# Patient Record
Sex: Female | Born: 1975 | Race: White | Hispanic: No | Marital: Married | Smoking: Never smoker
Health system: Southern US, Community
[De-identification: ages and names within clinical notes are randomized; demographics above are authoritative.]

## PROBLEM LIST (undated history)

## (undated) DIAGNOSIS — F419 Anxiety disorder, unspecified: Secondary | ICD-10-CM

## (undated) DIAGNOSIS — N6320 Unspecified lump in the left breast, unspecified quadrant: Principal | ICD-10-CM

## (undated) DIAGNOSIS — H04129 Dry eye syndrome of unspecified lacrimal gland: Secondary | ICD-10-CM

## (undated) HISTORY — DX: Anxiety disorder, unspecified: F41.9

## (undated) HISTORY — DX: Dry eye syndrome of unspecified lacrimal gland: H04.129

## (undated) HISTORY — DX: Unspecified lump in the left breast, unspecified quadrant: N63.20

---

## 2006-03-03 ENCOUNTER — Emergency Department: Payer: Self-pay | Admitting: Emergency Medicine

## 2012-03-30 ENCOUNTER — Emergency Department: Payer: Self-pay | Admitting: *Deleted

## 2012-03-30 LAB — URINALYSIS, COMPLETE
Bilirubin,UR: NEGATIVE
Blood: NEGATIVE
Glucose,UR: NEGATIVE mg/dL (ref 0–75)
Nitrite: NEGATIVE
Specific Gravity: 1.014 (ref 1.003–1.030)
Squamous Epithelial: 2
WBC UR: 1 /HPF (ref 0–5)

## 2012-03-30 LAB — CBC
HCT: 41.6 % (ref 35.0–47.0)
MCH: 29.2 pg (ref 26.0–34.0)
MCHC: 32.6 g/dL (ref 32.0–36.0)
MCV: 90 fL (ref 80–100)
Platelet: 303 10*3/uL (ref 150–440)
RBC: 4.64 10*6/uL (ref 3.80–5.20)
RDW: 13.2 % (ref 11.5–14.5)
WBC: 9.8 10*3/uL (ref 3.6–11.0)

## 2012-03-30 LAB — COMPREHENSIVE METABOLIC PANEL
Albumin: 3.5 g/dL (ref 3.4–5.0)
Alkaline Phosphatase: 101 U/L (ref 50–136)
Anion Gap: 10 (ref 7–16)
BUN: 8 mg/dL (ref 7–18)
Bilirubin,Total: 0.5 mg/dL (ref 0.2–1.0)
Calcium, Total: 8.2 mg/dL — ABNORMAL LOW (ref 8.5–10.1)
Creatinine: 0.76 mg/dL (ref 0.60–1.30)
EGFR (African American): >60
Glucose: 83 mg/dL (ref 65–99)
Osmolality: 279 (ref 275–301)
Potassium: 3.8 mmol/L (ref 3.5–5.1)
SGOT(AST): 30 U/L (ref 15–37)
SGPT (ALT): 46 U/L
Total Protein: 7.3 g/dL (ref 6.4–8.2)

## 2014-12-09 ENCOUNTER — Ambulatory Visit: Payer: Self-pay | Admitting: Physician Assistant

## 2017-02-07 ENCOUNTER — Encounter: Payer: Self-pay | Admitting: Obstetrics and Gynecology

## 2017-02-07 ENCOUNTER — Ambulatory Visit (INDEPENDENT_AMBULATORY_CARE_PROVIDER_SITE_OTHER): Payer: 59 | Admitting: Obstetrics and Gynecology

## 2017-02-07 VITALS — BP 124/74 | Ht 66.0 in | Wt 242.0 lb

## 2017-02-07 DIAGNOSIS — Z1389 Encounter for screening for other disorder: Secondary | ICD-10-CM | POA: Diagnosis not present

## 2017-02-07 DIAGNOSIS — Z1339 Encounter for screening examination for other mental health and behavioral disorders: Secondary | ICD-10-CM

## 2017-02-07 DIAGNOSIS — Z1231 Encounter for screening mammogram for malignant neoplasm of breast: Secondary | ICD-10-CM

## 2017-02-07 DIAGNOSIS — Z01419 Encounter for gynecological examination (general) (routine) without abnormal findings: Secondary | ICD-10-CM

## 2017-02-07 DIAGNOSIS — Z30433 Encounter for removal and reinsertion of intrauterine contraceptive device: Secondary | ICD-10-CM

## 2017-02-07 DIAGNOSIS — E6609 Other obesity due to excess calories: Secondary | ICD-10-CM

## 2017-02-07 DIAGNOSIS — Z124 Encounter for screening for malignant neoplasm of cervix: Secondary | ICD-10-CM | POA: Diagnosis not present

## 2017-02-07 DIAGNOSIS — Z1239 Encounter for other screening for malignant neoplasm of breast: Secondary | ICD-10-CM

## 2017-02-07 DIAGNOSIS — Z1331 Encounter for screening for depression: Secondary | ICD-10-CM

## 2017-02-07 DIAGNOSIS — Z6839 Body mass index (BMI) 39.0-39.9, adult: Secondary | ICD-10-CM

## 2017-02-07 MED ORDER — LEVONORGESTREL 20 MCG/24HR IU IUD: 1.0000 | INTRAUTERINE_SYSTEM | Freq: Once | INTRAUTERINE | 0 refills | Status: DC

## 2017-02-07 NOTE — Progress Notes (Signed)
Gynecology Annual Exam  PCP: Pcp Not In System  Chief Complaint  Patient presents with  . Annual Exam    History of Present Illness:  Ms. Jennifer Freeman is a 41 y.o. G1P0101 who LMP was No LMP recorded. Patient is not currently having periods (Reason: IUD)., presents today for her annual examination.  Her menses are rare, lasting.  Dysmenorrhea none. She does not have intermenstrual bleeding.  She is single partner, contraception - IUD.  Last Pap: July 05, 2014  Results were: no abnormalities /neg HPV DNA negative Hx of STDs: none  Last mammogram: October 03, 2015  Results were: normal--routine follow-up in 12 months There is no FH of breast cancer. There is no FH of ovarian cancer. The patient does do self-breast exams.  Tobacco use: The patient denies current or previous tobacco use. Alcohol use: social drinker Exercise: not active  The patient wears seatbelts: yes.     Her IUD was placed in 01/2012. She has had no issues with it and would like it replaced.      Review of Systems: Review of Systems  Constitutional: Negative.   HENT: Negative.   Eyes: Negative.   Respiratory: Negative.   Cardiovascular: Negative.   Gastrointestinal: Negative.   Genitourinary: Negative.   Musculoskeletal: Negative.   Skin: Negative.   Neurological: Negative.   Psychiatric/Behavioral: Negative.     Past Medical History: History reviewed. No pertinent past medical history.   Past Surgical History:  Procedure Laterality Date  . CESAREAN SECTION      Medications:   Medication Sig Start Date End Date Taking? Authorizing Provider  levonorgestrel (MIRENA) 20 MCG/24HR IUD 1 each by Intrauterine route once.   Yes Historical Provider, MD    Allergies: No Known Allergies  Gynecologic History: No LMP recorded. Patient is not currently having periods (Reason: IUD).  Obstetric History: G1P0101  Social History   Social History  . Marital status: Married    Spouse name: N/A  .  Number of children: N/A  . Years of education: N/A   Occupational History  . Not on file.   Social History Main Topics  . Smoking status: Never Smoker  . Smokeless tobacco: Never Used  . Alcohol use Yes  . Drug use: Unknown  . Sexual activity: Yes    Birth control/ protection: IUD   Other Topics Concern  . Not on file   Social History Narrative  . No narrative on file    Family History: no gyn cancers  Physical Exam BP 124/74   Ht 5' 6"  (1.676 m)   Wt 242 lb (109.8 kg)   BMI 39.06 kg/m   Physical Exam  Constitutional: She is oriented to person, place, and time. She appears well-developed and well-nourished. No distress.  HENT:  Head: Normocephalic and atraumatic.  Eyes: Conjunctivae are normal.  Neck: Normal range of motion. Neck supple. No thyromegaly present.  Cardiovascular: Normal rate, regular rhythm and normal heart sounds.  Exam reveals no gallop and no friction rub.   No murmur heard. Pulmonary/Chest: Effort normal and breath sounds normal. She has no wheezes.  Abdominal: Soft. She exhibits no distension and no mass. There is no tenderness. There is no rebound and no guarding. No hernia. Hernia confirmed negative in the right inguinal area and confirmed negative in the left inguinal area.  Genitourinary: Pelvic exam was performed with patient supine. There is no rash, tenderness or lesion on the right labia. There is no rash, tenderness or lesion on the  left labia.  Musculoskeletal: Normal range of motion.  Lymphadenopathy:       Right: No inguinal adenopathy present.       Left: No inguinal adenopathy present.  Neurological: She is alert and oriented to person, place, and time.  Skin: Skin is warm and dry. No rash noted.  Psychiatric: She has a normal mood and affect. Her behavior is normal.    IUD Removal and new IUD insertion Procedure: IUD Removal  Patient identified, informed consent performed, consent signed.  Patient was in the dorsal lithotomy  position, normal external genitalia was noted.  A speculum was placed in the patient's vagina, normal discharge was noted, no lesions. The cervix was visualized, no lesions, no abnormal discharge.  The strings of the IUD were grasped and pulled using ring forceps. The IUD was removed in its entirety. Patient tolerated the procedure well.    IUD Insertion Procedure Note Patient identified, informed consent performed, consent signed.   Discussed risks of irregular bleeding, cramping, infection, malpositioning, expulsion or uterine perforation of the IUD (1:1000 placements)  which may require further procedure such as laparoscopy.  IUD while effective at preventing pregnancy do not prevent transmission of sexually transmitted diseases and use of barrier methods for this purpose was discussed. Time out was performed.  Urine pregnancy test negative.  Mirena intrauterine device is device to be inserted.  Speculum already placed in the vagina at this point in procedure due to removal of prior IUD.  Cervix visualized.  Cleaned with Betadine x 2.  Grasped anteriorly with a single tooth tenaculum.  Uterus sounded to 7.5 cm. IUD placed per manufacturer's recommendations.  Strings trimmed to 3 cm. Tenaculum was removed, good hemostasis noted.  Patient tolerated procedure well.   Patient was given post-procedure instructions.  She was advised to have backup contraception for one week.  Patient was also asked to check IUD strings periodically.  Female chaperone present for pelvic and breast  portions of the physical exam  Results: AUDIT Questionnaire (screen for alcoholism): 1 PHQ-9: 0   Assessment: 41 y.o. G1P0101 here for routine annual gynecologic exam and IUD removal and replacement.  Plan:  1. Women's annual routine gynecological examination -- Blood pressure screen normal. -- Colonoscopy - not due -- Weight screening: obese.  Management per PCP. -- Nutrition: normal -- cholesterol screening: n/a --  osteoporosis screening: n/a -- tobacco screening: not using -- family history of breast cancer screening: done. not at high risk. -- no evidence of domestic violence or intimate partner violence. -- STD screening: gonorrhea/chlamydia NAAT not collected per patient request.  2. Class 2 obesity due to excess calories without serious comorbidity with body mass index (BMI) of 39.0 to 39.9 in adult Per PCP  3. Screening for depression Normal PHQ-9  4. Screening for alcohol problem Normal AUDIT questionnaire  5. Pap smear for cervical cancer screening - IGP, Aptima HPV, rfx 16/18,45  (pap for woman older than 30)  6. Encounter for removal and reinsertion of intrauterine contraceptive device (IUD) - levonorgestrel (MIRENA) 20 MCG/24HR IUD; 1 Intra Uterine Device (1 each total) by Intrauterine route once.  Dispense: 1 each; Refill: 0  7. Screening for breast cancer - MM DIGITAL SCREENING BILATERAL; Future  -- Mammogram - due. Ordered. She will arrange at [facility].  Prentice Docker, MD 02/07/2017 11:14 AM

## 2017-02-10 LAB — IGP, APTIMA HPV, RFX 16/18,45
HPV Aptima: NEGATIVE
PAP Smear Comment: 0

## 2017-02-16 ENCOUNTER — Encounter: Payer: Self-pay | Admitting: Obstetrics and Gynecology

## 2017-02-28 ENCOUNTER — Encounter: Payer: Self-pay | Admitting: Obstetrics and Gynecology

## 2017-04-04 ENCOUNTER — Ambulatory Visit
Admission: RE | Admit: 2017-04-04 | Discharge: 2017-04-04 | Disposition: A | Discharge status: Home / Self Care | Payer: 59 | Source: Ambulatory Visit | Attending: Obstetrics and Gynecology | Admitting: Obstetrics and Gynecology

## 2017-04-04 DIAGNOSIS — Z1231 Encounter for screening mammogram for malignant neoplasm of breast: Secondary | ICD-10-CM | POA: Diagnosis not present

## 2017-04-04 DIAGNOSIS — Z01419 Encounter for gynecological examination (general) (routine) without abnormal findings: Secondary | ICD-10-CM

## 2017-04-04 DIAGNOSIS — Z1239 Encounter for other screening for malignant neoplasm of breast: Secondary | ICD-10-CM

## 2017-04-07 ENCOUNTER — Other Ambulatory Visit: Payer: Self-pay | Admitting: *Deleted

## 2017-04-07 ENCOUNTER — Inpatient Hospital Stay
Admission: RE | Admit: 2017-04-07 | Discharge: 2017-04-07 | Disposition: A | Discharge status: Home / Self Care | Payer: Self-pay | Source: Ambulatory Visit | Attending: *Deleted | Admitting: *Deleted

## 2017-04-07 DIAGNOSIS — Z9289 Personal history of other medical treatment: Secondary | ICD-10-CM

## 2017-04-08 ENCOUNTER — Other Ambulatory Visit: Payer: Self-pay | Admitting: Obstetrics and Gynecology

## 2017-04-08 DIAGNOSIS — R928 Other abnormal and inconclusive findings on diagnostic imaging of breast: Secondary | ICD-10-CM

## 2017-04-08 DIAGNOSIS — N6489 Other specified disorders of breast: Secondary | ICD-10-CM

## 2017-04-15 ENCOUNTER — Ambulatory Visit
Admission: RE | Admit: 2017-04-15 | Discharge: 2017-04-15 | Disposition: A | Discharge status: Home / Self Care | Payer: 59 | Source: Ambulatory Visit | Attending: Obstetrics and Gynecology | Admitting: Obstetrics and Gynecology

## 2017-04-15 DIAGNOSIS — R928 Other abnormal and inconclusive findings on diagnostic imaging of breast: Secondary | ICD-10-CM

## 2017-04-15 DIAGNOSIS — N6489 Other specified disorders of breast: Secondary | ICD-10-CM | POA: Diagnosis not present

## 2017-04-21 ENCOUNTER — Other Ambulatory Visit: Payer: Self-pay | Admitting: Obstetrics and Gynecology

## 2017-04-21 ENCOUNTER — Telehealth: Payer: Self-pay | Admitting: Obstetrics and Gynecology

## 2017-04-21 ENCOUNTER — Encounter: Payer: Self-pay | Admitting: Obstetrics and Gynecology

## 2017-04-21 DIAGNOSIS — N6320 Unspecified lump in the left breast, unspecified quadrant: Secondary | ICD-10-CM

## 2017-04-21 HISTORY — DX: Unspecified lump in the left breast, unspecified quadrant: N63.20

## 2017-04-21 NOTE — Telephone Encounter (Signed)
Patient is scheduled for Monday, 07/18/17 @ 9:20am. Lmtrc.

## 2017-04-21 NOTE — Telephone Encounter (Signed)
Patient is aware of appt.

## 2017-04-21 NOTE — Telephone Encounter (Signed)
-----   Message from Will Bonnet, MD sent at 04/21/2017  2:47 PM EDT ----- Regarding: Follow up breast ultrasound I just placed an order for a left breast ultrasound for 3 months from 04/15/17, as recommended by [place]. Please help me arrange this follow up and let me know how to correct the order I placed ;) Thank you.

## 2017-07-18 ENCOUNTER — Other Ambulatory Visit: Payer: 59

## 2017-10-20 ENCOUNTER — Other Ambulatory Visit: Payer: 59

## 2019-06-28 ENCOUNTER — Ambulatory Visit: Payer: 59 | Admitting: Obstetrics and Gynecology

## 2019-07-19 ENCOUNTER — Ambulatory Visit: Payer: Self-pay | Admitting: Obstetrics and Gynecology

## 2019-08-08 ENCOUNTER — Ambulatory Visit (INDEPENDENT_AMBULATORY_CARE_PROVIDER_SITE_OTHER): Payer: BC Managed Care – PPO | Admitting: Obstetrics and Gynecology

## 2019-08-08 ENCOUNTER — Telehealth: Payer: Self-pay

## 2019-08-08 ENCOUNTER — Other Ambulatory Visit: Payer: Self-pay

## 2019-08-08 ENCOUNTER — Encounter: Payer: Self-pay | Admitting: Obstetrics and Gynecology

## 2019-08-08 ENCOUNTER — Other Ambulatory Visit: Payer: Self-pay | Admitting: Obstetrics and Gynecology

## 2019-08-08 VITALS — BP 130/65 | HR 79 | Ht 66.0 in | Wt 246.0 lb

## 2019-08-08 DIAGNOSIS — N6489 Other specified disorders of breast: Secondary | ICD-10-CM

## 2019-08-08 DIAGNOSIS — Z01419 Encounter for gynecological examination (general) (routine) without abnormal findings: Secondary | ICD-10-CM | POA: Diagnosis not present

## 2019-08-08 DIAGNOSIS — Z1231 Encounter for screening mammogram for malignant neoplasm of breast: Secondary | ICD-10-CM

## 2019-08-08 DIAGNOSIS — R928 Other abnormal and inconclusive findings on diagnostic imaging of breast: Secondary | ICD-10-CM

## 2019-08-08 DIAGNOSIS — Z1339 Encounter for screening examination for other mental health and behavioral disorders: Secondary | ICD-10-CM

## 2019-08-08 DIAGNOSIS — Z1331 Encounter for screening for depression: Secondary | ICD-10-CM

## 2019-08-08 NOTE — Telephone Encounter (Signed)
Pt had annual today called Jennifer Freeman to schedule her mammogram, they advised her [name] would need to put in special orders first,

## 2019-08-08 NOTE — Progress Notes (Signed)
Gynecology Annual Exam  PCP: Elton Sin, DO  Chief Complaint  Patient presents with  . Gynecologic Exam   History of Present Illness:  Ms. Jennifer Freeman is a 43 y.o. G1P0101 who LMP was No LMP recorded. (Menstrual status: IUD)., presents today for her annual examination.  Her menses are absent with her IUD.  She is sexually active, contraception - IUD.  Last Pap: 2.5 years  Results were: no abnormalities /neg HPV DNA negative Hx of STDs: none  There is no FH of breast cancer. There is no FH of ovarian cancer. The patient does do self-breast exams.  Tobacco use: The patient denies current or previous tobacco use. Alcohol use: social drinker Exercise: no targeted exercise.   IUD last replaced on 01/2017  The patient wears seatbelts: yes.   The patient reports that domestic violence in her life is absent.   Last Mammogram: 04/2017: Bi-rads 3.  Three month follow up recommended. I see no report of follow up.  Patient states that she called back to scheduled and after discussion with radiology, the appointment was not scheduled.   Past Medical History:  Diagnosis Date  . Anxiety   . Dry eye   . Left breast mass 04/21/2017    Past Surgical History:  Procedure Laterality Date  . CESAREAN SECTION      Prior to Admission medications   Medication Sig Start Date End Date Taking? Authorizing Provider  RESTASIS MULTIDOSE 0.05 % ophthalmic emulsion INT 1 GTT IN OU BID 06/21/19  Yes [provider]  levonorgestrel (MIRENA) 20 MCG/24HR IUD 1 Intra Uterine Device (1 each total) by Intrauterine route once. 02/07/17 02/07/17  Will Bonnet, MD   Allergies: No Known Allergies  Obstetric History: G1P0101  Social History   Socioeconomic History  . Marital status: Married    Spouse name: Not on file  . Number of children: Not on file  . Years of education: Not on file  . Highest education level: Not on file  Occupational History  . Not on file  Social Needs  .  Financial resource strain: Not on file  . Food insecurity    Worry: Not on file    Inability: Not on file  . Transportation needs    Medical: Not on file    Non-medical: Not on file  Tobacco Use  . Smoking status: Never Smoker  . Smokeless tobacco: Never Used  Substance and Sexual Activity  . Alcohol use: Yes  . Drug use: Never  . Sexual activity: Yes    Birth control/protection: I.U.D.  Lifestyle  . Physical activity    Days per week: Not on file    Minutes per session: Not on file  . Stress: Not on file  Relationships  . Social Herbalist on phone: Not on file    Gets together: Not on file    Attends religious service: Not on file    Active member of club or organization: Not on file    Attends meetings of clubs or organizations: Not on file    Relationship status: Not on file  . Intimate partner violence    Fear of current or ex partner: Not on file    Emotionally abused: Not on file    Physically abused: Not on file    Forced sexual activity: Not on file  Other Topics Concern  . Not on file  Social History Narrative  . Not on file    Family History  Problem Relation Age of Onset  . Breast cancer Neg Hx     Review of Systems  Constitutional: Negative.   HENT: Negative.   Eyes: Negative.   Respiratory: Negative.   Cardiovascular: Negative.   Gastrointestinal: Negative.   Genitourinary: Negative.   Musculoskeletal: Negative.   Skin: Negative.   Neurological: Positive for headaches. Negative for dizziness, tingling, tremors, sensory change, speech change, focal weakness, seizures, loss of consciousness and weakness.  Psychiatric/Behavioral: Negative for depression, hallucinations, memory loss, substance abuse and suicidal ideas. The patient is nervous/anxious. The patient does not have insomnia.      Physical Exam BP 130/65 (BP Location: Left Arm, Patient Position: Sitting, Cuff Size: Large)   Pulse 79   Ht 5' 6"  (1.676 m)   Wt 246 lb (111.6 kg)    BMI 39.71 kg/m    Physical Exam Constitutional:      General: She is not in acute distress.    Appearance: Normal appearance. She is well-developed.  Genitourinary:     Pelvic exam was performed with patient in the lithotomy position.     Vulva, urethra, bladder and uterus normal.     No inguinal adenopathy present in the right or left side.    No signs of injury in the vagina.     No vaginal discharge, erythema, tenderness or bleeding.     No cervical motion tenderness, discharge, lesion or polyp.     IUD strings visualized.     Uterus is mobile.     Uterus is not enlarged or tender.     No uterine mass detected.    Uterus is anteverted.     No right or left adnexal mass present.     Right adnexa not tender or full.     Left adnexa not tender or full.  HENT:     Head: Normocephalic and atraumatic.  Eyes:     General: No scleral icterus.    Conjunctiva/sclera: Conjunctivae normal.  Neck:     Musculoskeletal: Normal range of motion and neck supple.     Thyroid: No thyromegaly.  Cardiovascular:     Rate and Rhythm: Normal rate and regular rhythm.     Heart sounds: No murmur. No friction rub. No gallop.   Pulmonary:     Effort: Pulmonary effort is normal. No respiratory distress.     Breath sounds: Normal breath sounds. No wheezing or rales.  Chest:     Breasts:        Right: No inverted nipple, mass, nipple discharge, skin change or tenderness.        Left: No inverted nipple, mass, nipple discharge, skin change or tenderness.  Abdominal:     General: Bowel sounds are normal. There is no distension.     Palpations: Abdomen is soft. There is no mass.     Tenderness: There is no abdominal tenderness. There is no guarding or rebound.  Musculoskeletal: Normal range of motion.        General: No swelling or tenderness.  Lymphadenopathy:     Cervical: No cervical adenopathy.     Lower Body: No right inguinal adenopathy. No left inguinal adenopathy.  Neurological:      General: No focal deficit present.     Mental Status: She is alert and oriented to person, place, and time.     Cranial Nerves: No cranial nerve deficit.  Skin:    General: Skin is warm and dry.     Findings: No erythema or rash.  Psychiatric:        Mood and Affect: Mood normal.        Behavior: Behavior normal.        Judgment: Judgment normal.  Vitals signs reviewed. Exam conducted with a chaperone present.     Female chaperone present for pelvic and breast  portions of the physical exam  Results: AUDIT Questionnaire (screen for alcoholism): 2 PHQ-9: 2   Assessment: 43 y.o. G16P0101 female here for routine annual gynecologic examination  Plan: Problem List Items Addressed This Visit    None    Visit Diagnoses    Women's annual routine gynecological examination    -  Primary   Screening for depression       Screening for alcoholism         Screening: -- Blood pressure screen normal -- Weight screening: obese: discussed management options, including lifestyle, dietary, and exercise. -- Depression screening negative (PHQ-9) -- Nutrition: normal -- cholesterol screening: not due for screening -- osteoporosis screening: not due -- tobacco screening: not using -- alcohol screening: AUDIT questionnaire indicates low-risk usage. -- family history of breast cancer screening: done. not at high risk. -- no evidence of domestic violence or intimate partner violence. -- STD screening: gonorrhea/chlamydia NAAT not collected per patient request. -- pap smear not collected per ASCCP guidelines -- flu vaccine declines -- HPV vaccination series: not eligilbe  Prentice Docker, MD 08/08/2019 3:04 PM

## 2019-08-09 ENCOUNTER — Telehealth: Payer: Self-pay | Admitting: Obstetrics and Gynecology

## 2019-08-09 NOTE — Telephone Encounter (Signed)
Advise

## 2019-08-09 NOTE — Telephone Encounter (Signed)
Called and spoke with patient she is aware of her appt time and date at [facility] on Friday 08/31/2019 at 2:40pm.

## 2019-08-09 NOTE — Telephone Encounter (Signed)
Orders are in and [name] has signed them. Scheduled patient her her mammogram.

## 2019-08-09 NOTE — Telephone Encounter (Signed)
Can you figure out what orders they want me to place?  Diagnostic mammo? Tomo screening?  Ultrasounds?   Thanks

## 2019-08-31 ENCOUNTER — Ambulatory Visit
Admission: RE | Admit: 2019-08-31 | Discharge: 2019-08-31 | Disposition: A | Discharge status: Home / Self Care | Payer: BC Managed Care – PPO | Source: Ambulatory Visit | Attending: Obstetrics and Gynecology | Admitting: Obstetrics and Gynecology

## 2019-08-31 DIAGNOSIS — R928 Other abnormal and inconclusive findings on diagnostic imaging of breast: Secondary | ICD-10-CM | POA: Diagnosis not present

## 2019-08-31 DIAGNOSIS — N6489 Other specified disorders of breast: Secondary | ICD-10-CM

## 2019-08-31 DIAGNOSIS — Z1231 Encounter for screening mammogram for malignant neoplasm of breast: Secondary | ICD-10-CM

## 2020-08-07 ENCOUNTER — Other Ambulatory Visit: Payer: Self-pay | Admitting: Obstetrics and Gynecology

## 2020-08-07 DIAGNOSIS — Z1231 Encounter for screening mammogram for malignant neoplasm of breast: Secondary | ICD-10-CM

## 2020-08-14 ENCOUNTER — Ambulatory Visit: Payer: BC Managed Care – PPO | Admitting: Obstetrics and Gynecology

## 2020-09-01 ENCOUNTER — Ambulatory Visit
Admission: RE | Admit: 2020-09-01 | Discharge: 2020-09-01 | Disposition: A | Discharge status: Home / Self Care | Payer: BC Managed Care – PPO | Source: Ambulatory Visit | Attending: Obstetrics and Gynecology | Admitting: Obstetrics and Gynecology

## 2020-09-01 ENCOUNTER — Other Ambulatory Visit: Payer: Self-pay

## 2020-09-01 DIAGNOSIS — Z1231 Encounter for screening mammogram for malignant neoplasm of breast: Secondary | ICD-10-CM | POA: Insufficient documentation

## 2020-11-20 IMAGING — MG DIGITAL SCREENING BILAT W/ TOMO W/ CAD
8 series · 8 of 24 positions shown · non-contrast
Comparison: Previous exam(s).

CLINICAL DATA: Screening.

EXAM:
DIGITAL SCREENING BILATERAL MAMMOGRAM WITH TOMO AND CAD

[L MLO synth-2D]
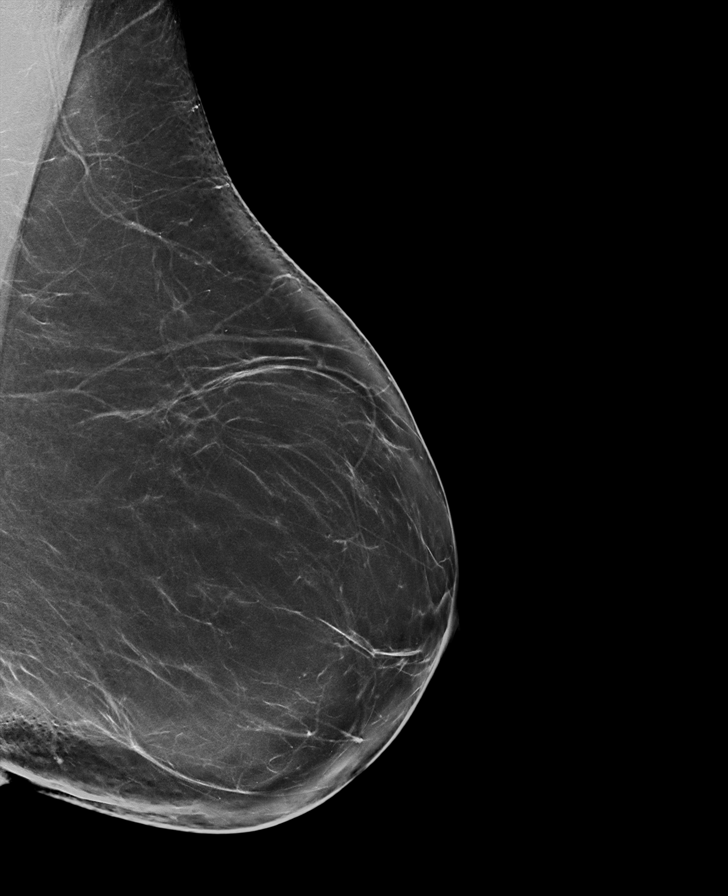

[R CC synth-2D]
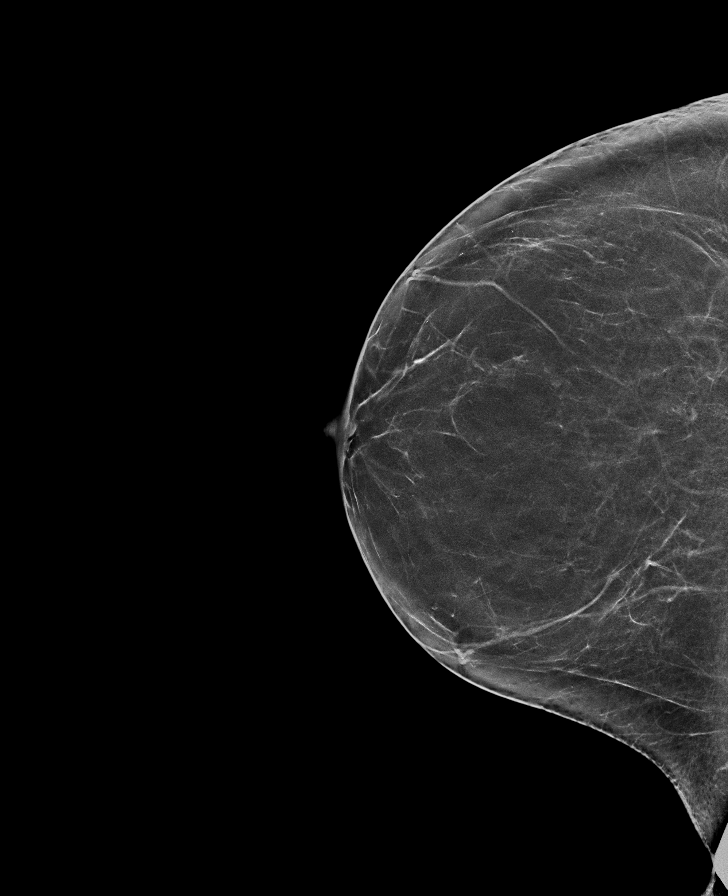

[L CC synth-2D]
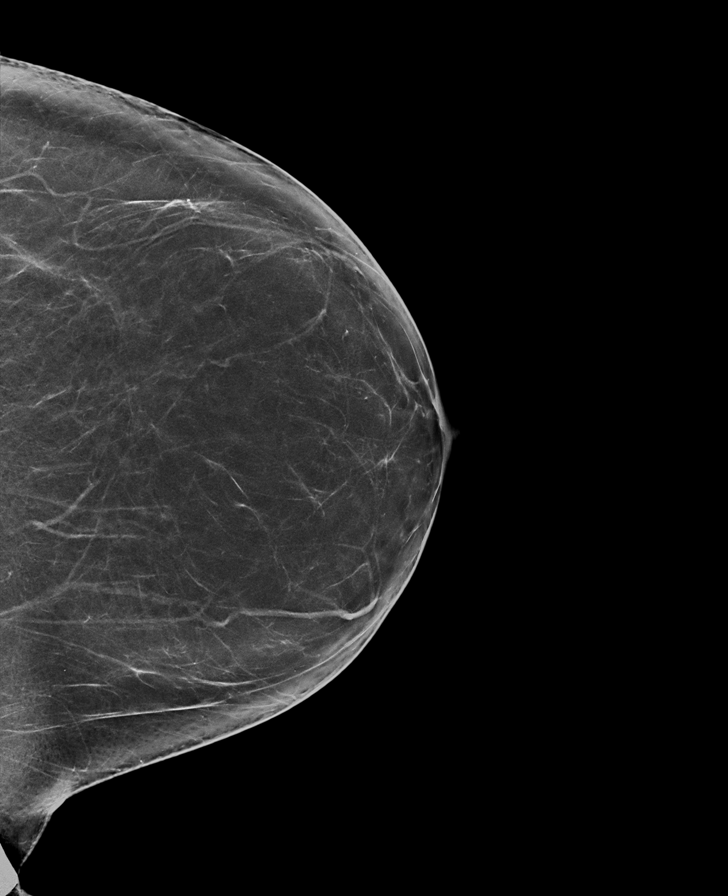

[R MLO synth-2D]
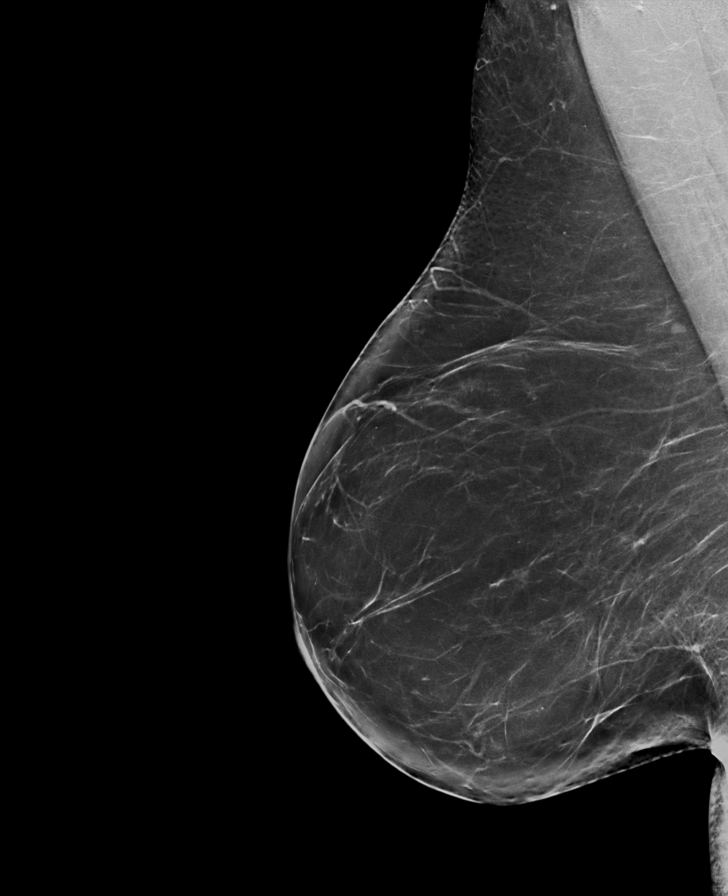

[R CC tomo · tomo slice 34/67.0]
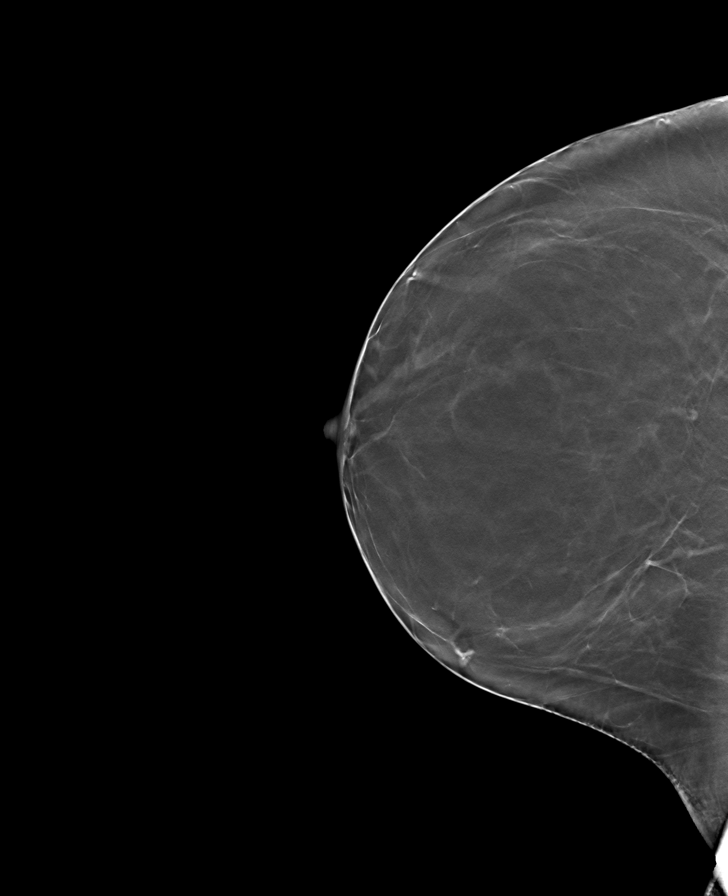

[R MLO tomo · tomo slice 42/83.0]
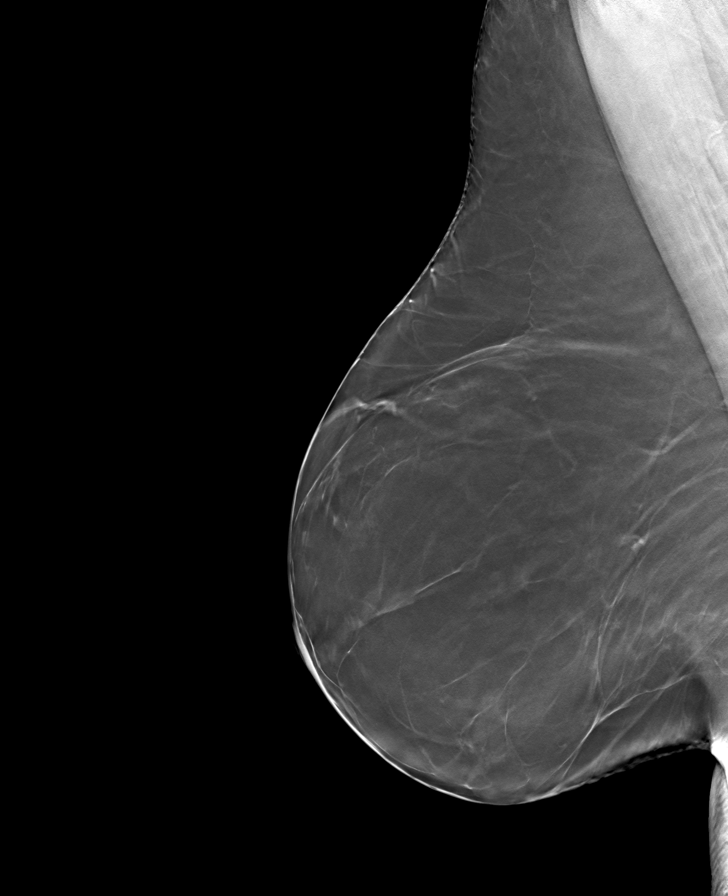

[L CC tomo · tomo slice 39/76.0]
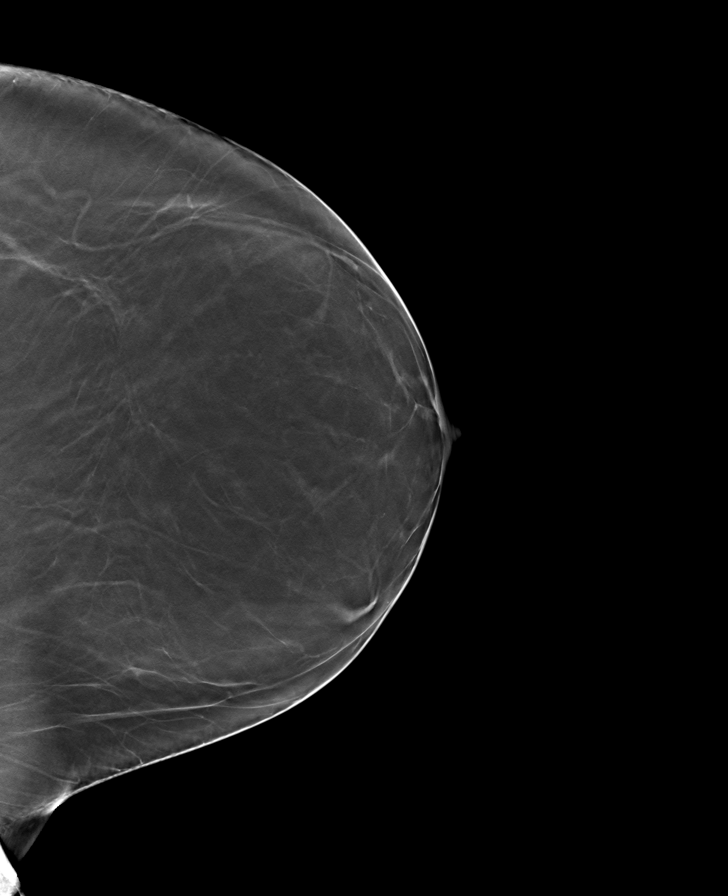

[L MLO tomo · tomo slice 43/84.0]
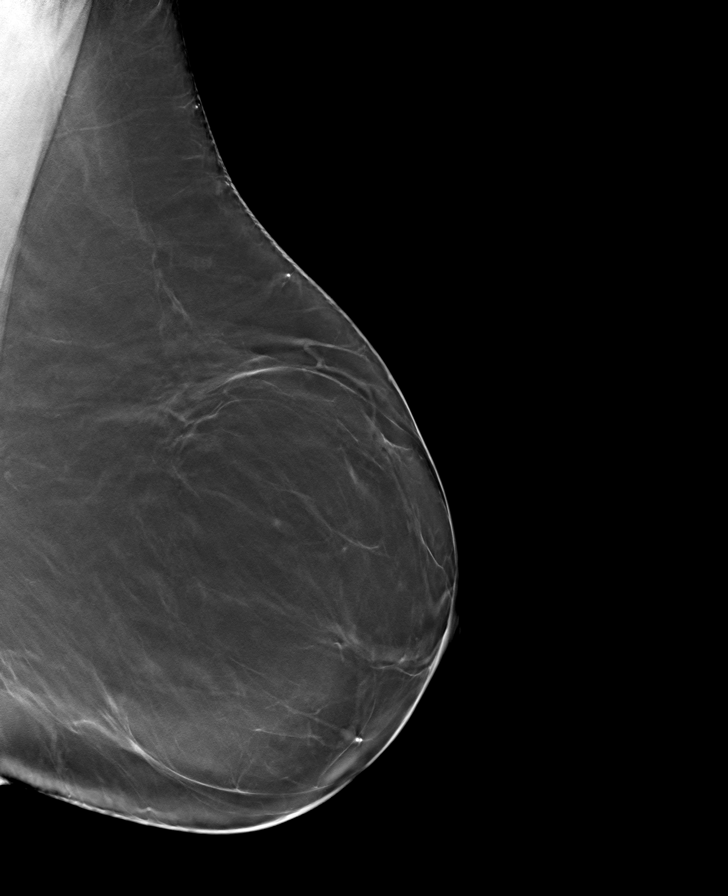

[8 of 24 positions shown; findings below may reference images not displayed]

ACR Breast Density Category b: There are scattered areas of
fibroglandular density.
FINDINGS: There are no findings suspicious for malignancy. Images were
processed with CAD.
IMPRESSION: No mammographic evidence of malignancy. A result letter of this
screening mammogram will be mailed directly to the patient.

RECOMMENDATION:
Screening mammogram in one year. (Code:[id number])

BI-RADS CATEGORY  1: Negative.

## 2021-07-03 LAB — EXTERNAL GENERIC LAB PROCEDURE: COLOGUARD: NEGATIVE

## 2021-09-21 ENCOUNTER — Telehealth: Payer: Self-pay

## 2021-09-21 NOTE — Telephone Encounter (Signed)
Pt calling; recv'd letter from [name] that it's time for her mammogram; she called to schedule it and was told she needs an order from [place].  [phone]

## 2021-09-22 NOTE — Telephone Encounter (Signed)
I've never seen the patient and she hasn't been seen for 2 year she needs to make an annual appointment so we can do a breast exam first

## 2021-09-22 NOTE — Telephone Encounter (Signed)
Patient is aware she needs to be seen first

## 2021-10-15 ENCOUNTER — Other Ambulatory Visit (HOSPITAL_COMMUNITY)
Admission: RE | Admit: 2021-10-15 | Discharge: 2021-10-15 | Disposition: A | Discharge status: Home / Self Care | Payer: BC Managed Care – PPO | Source: Ambulatory Visit | Attending: Obstetrics and Gynecology | Admitting: Obstetrics and Gynecology

## 2021-10-15 ENCOUNTER — Ambulatory Visit (INDEPENDENT_AMBULATORY_CARE_PROVIDER_SITE_OTHER): Payer: BC Managed Care – PPO | Admitting: Obstetrics and Gynecology

## 2021-10-15 ENCOUNTER — Other Ambulatory Visit: Payer: Self-pay

## 2021-10-15 ENCOUNTER — Encounter: Payer: Self-pay | Admitting: Obstetrics and Gynecology

## 2021-10-15 VITALS — BP 114/75 | HR 80 | Ht 66.0 in | Wt 244.0 lb

## 2021-10-15 DIAGNOSIS — Z01419 Encounter for gynecological examination (general) (routine) without abnormal findings: Secondary | ICD-10-CM | POA: Diagnosis not present

## 2021-10-15 DIAGNOSIS — Z124 Encounter for screening for malignant neoplasm of cervix: Secondary | ICD-10-CM | POA: Diagnosis not present

## 2021-10-15 DIAGNOSIS — Z1239 Encounter for other screening for malignant neoplasm of breast: Secondary | ICD-10-CM | POA: Diagnosis not present

## 2021-10-15 DIAGNOSIS — Z30431 Encounter for routine checking of intrauterine contraceptive device: Secondary | ICD-10-CM

## 2021-10-15 NOTE — Patient Instructions (Signed)
[facility] [place] [address]  [facility]  [address] [place] [address]  Phone: [phone]

## 2021-10-15 NOTE — Progress Notes (Signed)
Gynecology Annual Exam  PCP: Elton Sin, DO  Chief Complaint:  Chief Complaint  Patient presents with   Gynecologic Exam    Annual - no concerns. RM 3    History of Present Illness: Patient is a 45 y.o. G1P0101 presents for annual exam. The patient has no complaints today.   LMP: No LMP recorded. (Menstrual status: IUD). Amenorrhea Mirena IUD   The patient is sexually active. She currently uses IUD for contraception. She denies dyspareunia.  The patient does perform self breast exams.  There is no notable family history of breast or ovarian cancer in her family.  The patient wears seatbelts: yes.   The patient has regular exercise: not asked.    The patient denies current symptoms of depression.    Review of Systems: Review of Systems  Constitutional:  Negative for chills and fever.  HENT:  Negative for congestion.   Respiratory:  Negative for cough and shortness of breath.   Cardiovascular:  Negative for chest pain and palpitations.  Gastrointestinal:  Negative for abdominal pain, constipation, diarrhea, heartburn, nausea and vomiting.  Genitourinary:  Negative for dysuria, frequency and urgency.  Skin:  Negative for itching and rash.  Neurological:  Negative for dizziness and headaches.  Endo/Heme/Allergies:  Negative for polydipsia.  Psychiatric/Behavioral:  Negative for depression.    Past Medical History:  Patient Active Problem List   Diagnosis Date Noted   Left breast mass 04/21/2017    Past Surgical History:  Past Surgical History:  Procedure Laterality Date   CESAREAN SECTION      Gynecologic History:  No LMP recorded. (Menstrual status: IUD). Contraception:02/07/17 IUD Last Pap: Results were: 02/07/2017 NIL and HR HPV negative  Last mammogram: 09/01/2020 Results were: BI-RAD I  Obstetric History: G1P0101  Family History:  Family History  Problem Relation Age of Onset   Hypertension Father    Lung cancer Father    Breast cancer Neg Hx      Social History:  Social History   Socioeconomic History   Marital status: Married    Spouse name: Not on file   Number of children: Not on file   Years of education: Not on file   Highest education level: Not on file  Occupational History   Not on file  Tobacco Use   Smoking status: Never   Smokeless tobacco: Never  Vaping Use   Vaping Use: Never used  Substance and Sexual Activity   Alcohol use: Yes   Drug use: Never   Sexual activity: Yes    Birth control/protection: I.U.D.  Other Topics Concern   Not on file  Social History Narrative   Not on file   Social Determinants of Health   Financial Resource Strain: Not on file  Food Insecurity: Not on file  Transportation Needs: Not on file  Physical Activity: Not on file  Stress: Not on file  Social Connections: Not on file  Intimate Partner Violence: Not on file    Allergies:  No Known Allergies  Medications: Prior to Admission medications   Medication Sig Start Date End Date Taking? Authorizing Provider  hydrOXYzine (ATARAX/VISTARIL) 25 MG tablet TK 1/2 TO 1 T PO Q 8 H PRA. 03/08/19  Yes [provider]  RESTASIS MULTIDOSE 0.05 % ophthalmic emulsion INT 1 GTT IN OU BID 06/21/19  Yes [provider]  SUMAtriptan (IMITREX) 50 MG tablet Take 2 tabs by mouth at headache onset. May repeat in 2 hrs with max 4 tabs in 24 hours  08/25/21  Yes [provider]  fluocinonide ointment (LIDEX) 0.05 % Apply topically 2 (two) times daily. 08/28/21   [provider]  levonorgestrel (MIRENA) 20 MCG/24HR IUD 1 Intra Uterine Device (1 each total) by Intrauterine route once. 02/07/17 02/07/17  Will Bonnet, MD    Physical Exam Vitals: Blood pressure 114/75, pulse 80, height 5' 6"  (1.676 m), weight 244 lb (110.7 kg). Body mass index is 39.38 kg/m.   General: NAD HEENT: normocephalic, anicteric Thyroid: no enlargement, no palpable nodules Pulmonary: No increased work of breathing,  CTAB Cardiovascular: RRR, distal pulses 2+ Breast: Breast symmetrical, no tenderness, no palpable nodules or masses, no skin or nipple retraction present, no nipple discharge.  No axillary or supraclavicular lymphadenopathy. Abdomen: NABS, soft, non-tender, non-distended.  Umbilicus without lesions.  No hepatomegaly, splenomegaly or masses palpable. No evidence of hernia  Genitourinary:  External: Normal external female genitalia.  Normal urethral meatus, normal Bartholin's and Skene's glands.    Vagina: Normal vaginal mucosa, no evidence of prolapse.    Cervix: Grossly normal in appearance, no bleeding, IUD string visualized  Uterus: Non-enlarged, mobile, normal contour.  No CMT  Adnexa: ovaries non-enlarged, no adnexal masses  Rectal: deferred  Lymphatic: no evidence of inguinal lymphadenopathy Extremities: no edema, erythema, or tenderness Neurologic: Grossly intact Psychiatric: mood appropriate, affect full  Female chaperone present for pelvic and breast  portions of the physical exam    Assessment: 45 y.o. G1P0101 routine annual exam  Plan: Problem List Items Addressed This Visit   None Visit Diagnoses     Encounter for gynecological examination without abnormal finding    -  Primary   Screening for malignant neoplasm of cervix       Relevant Orders   Cytology - PAP   MM 3D SCREEN BREAST BILATERAL   Breast screening       IUD check up           1) Mammogram - recommend yearly screening mammogram.  Mammogram Was ordered today   2) STI screening  was notoffered and therefore not obtained  3) ASCCP guidelines and rational discussed.  Patient opts for every 3 years screening interval  4) Contraception - the patient is currently using  IUD.  She is happy with her current form of contraception and plans to continue  5) Colonoscopy -- Screening recommended starting at age 60 for average risk individuals, age 84 for individuals deemed at increased risk (including African  Americans) and recommended to continue until age 22.  For patient age 31-85 individualized approach is recommended.  Gold standard screening is via colonoscopy, Cologuard screening is an acceptable alternative for patient unwilling or unable to undergo colonoscopy.  "Colorectal cancer screening for average?risk adults: 2018 guideline update from the American Cancer Society"CA: A Cancer Journal for Clinicians: Mar 30, 2017   6) Routine healthcare maintenance including cholesterol, diabetes screening discussed managed by PCP  7) No follow-ups on file.   Malachy Mood, MD, [facility], [facility] 10/15/2021, 3:52 PM

## 2021-10-20 LAB — CYTOLOGY - PAP
Comment: NEGATIVE
Diagnosis: NEGATIVE
High risk HPV: NEGATIVE

## 2021-10-28 ENCOUNTER — Ambulatory Visit
Admission: RE | Admit: 2021-10-28 | Discharge: 2021-10-28 | Disposition: A | Discharge status: Home / Self Care | Payer: BC Managed Care – PPO | Source: Ambulatory Visit | Attending: Obstetrics and Gynecology | Admitting: Obstetrics and Gynecology

## 2021-10-28 ENCOUNTER — Other Ambulatory Visit: Payer: Self-pay

## 2021-10-28 DIAGNOSIS — Z1231 Encounter for screening mammogram for malignant neoplasm of breast: Secondary | ICD-10-CM | POA: Insufficient documentation

## 2021-10-28 DIAGNOSIS — Z124 Encounter for screening for malignant neoplasm of cervix: Secondary | ICD-10-CM | POA: Diagnosis present

## 2021-10-31 ENCOUNTER — Encounter: Payer: Self-pay | Admitting: Obstetrics and Gynecology

## 2022-10-14 HISTORY — PX: OTHER SURGICAL HISTORY: SHX169

## 2022-10-18 NOTE — Progress Notes (Unsigned)
PCP: Claretta Fraise, DO   No chief complaint on file.   HPI:      Ms. Jennifer Freeman is a 46 y.o. G1P0101 whose LMP was No LMP recorded. (Menstrual status: IUD)., presents today for her annual examination.  Her menses are absent with IUD.  She {does:18564} have vasomotor sx.   Sex activity: single partner, contraception - IUD. Mirena placed 02/07/17. She {does:18564} have vaginal dryness.  Last Pap: 10/15/21 Results were: no abnormalities /neg HPV DNA.  Hx of STDs: {STD hx:14358}  Last mammogram: 10/28/21  Results were: normal--routine follow-up in 12 months There is no FH of breast cancer. There is no FH of ovarian cancer. The patient {does:18564} do self-breast exams.  Colonoscopy: never; 8/22 neg Cologuard; Repeat due after 3  years.   Tobacco use: {tob:20664} Alcohol use: {Alcohol:11675} No drug use Exercise: {exercise:31265}  She {does:18564} get adequate calcium and Vitamin D in her diet.  Labs with PCP.   Patient Active Problem List   Diagnosis Date Noted   Left breast mass 04/21/2017    Past Surgical History:  Procedure Laterality Date   CESAREAN SECTION      Family History  Problem Relation Age of Onset   Hypertension Father    Lung cancer Father    Breast cancer Paternal Grandmother 45    Social History   Socioeconomic History   Marital status: Married    Spouse name: Not on file   Number of children: Not on file   Years of education: Not on file   Highest education level: Not on file  Occupational History   Not on file  Tobacco Use   Smoking status: Never   Smokeless tobacco: Never  Vaping Use   Vaping Use: Never used  Substance and Sexual Activity   Alcohol use: Yes   Drug use: Never   Sexual activity: Yes    Birth control/protection: I.U.D.  Other Topics Concern   Not on file  Social History Narrative   Not on file   Social Determinants of Health   Financial Resource Strain: Not on file  Food Insecurity: Not on file   Transportation Needs: Not on file  Physical Activity: Not on file  Stress: Not on file  Social Connections: Not on file  Intimate Partner Violence: Not on file     Current Outpatient Medications:    fluocinonide ointment (LIDEX) 0.05 %, Apply topically 2 (two) times daily., Disp: , Rfl:    hydrOXYzine (ATARAX/VISTARIL) 25 MG tablet, TK 1/2 TO 1 T PO Q 8 H PRA., Disp: , Rfl:    levonorgestrel (MIRENA) 20 MCG/24HR IUD, 1 Intra Uterine Device (1 each total) by Intrauterine route once., Disp: 1 each, Rfl: 0   RESTASIS MULTIDOSE 0.05 % ophthalmic emulsion, INT 1 GTT IN OU BID, Disp: , Rfl:    SUMAtriptan (IMITREX) 50 MG tablet, Take 2 tabs by mouth at headache onset. May repeat in 2 hrs with max 4 tabs in 24 hours, Disp: , Rfl:      ROS:  Review of Systems BREAST: No symptoms    Objective: There were no vitals taken for this visit.   OBGyn Exam  Results: No results found for this or any previous visit (from the past 24 hour(s)).  Assessment/Plan:  No diagnosis found.   No orders of the defined types were placed in this encounter.           GYN counsel {counseling: 16159}    F/U  No follow-ups on  file.  Ilona Sorrel. Illiana Losurdo, PA-C 10/18/2022 8:24 PM

## 2022-10-19 ENCOUNTER — Encounter: Payer: Self-pay | Admitting: Obstetrics and Gynecology

## 2022-10-19 ENCOUNTER — Ambulatory Visit (INDEPENDENT_AMBULATORY_CARE_PROVIDER_SITE_OTHER): Payer: BC Managed Care – PPO | Admitting: Obstetrics and Gynecology

## 2022-10-19 VITALS — BP 120/80 | Ht 66.0 in | Wt 233.0 lb

## 2022-10-19 DIAGNOSIS — Z1329 Encounter for screening for other suspected endocrine disorder: Secondary | ICD-10-CM | POA: Diagnosis not present

## 2022-10-19 DIAGNOSIS — R61 Generalized hyperhidrosis: Secondary | ICD-10-CM

## 2022-10-19 DIAGNOSIS — Z30431 Encounter for routine checking of intrauterine contraceptive device: Secondary | ICD-10-CM

## 2022-10-19 DIAGNOSIS — Z01419 Encounter for gynecological examination (general) (routine) without abnormal findings: Secondary | ICD-10-CM | POA: Diagnosis not present

## 2022-10-19 DIAGNOSIS — N951 Menopausal and female climacteric states: Secondary | ICD-10-CM | POA: Diagnosis not present

## 2022-10-19 DIAGNOSIS — Z1231 Encounter for screening mammogram for malignant neoplasm of breast: Secondary | ICD-10-CM

## 2022-10-19 DIAGNOSIS — Z780 Asymptomatic menopausal state: Secondary | ICD-10-CM

## 2022-10-19 NOTE — Patient Instructions (Addendum)
I value your feedback and you entrusting us with your care. If you get a [facility] patient survey, I would appreciate you taking the time to let us know about your experience today. Thank you!  [facility]: [phone]      

## 2022-10-20 LAB — T4, FREE: Free T4: 1.11 ng/dL (ref 0.82–1.77)

## 2022-10-20 LAB — TSH: TSH: 4.17 u[IU]/mL (ref 0.450–4.500)

## 2022-10-20 LAB — ESTRADIOL: Estradiol: 13.4 pg/mL

## 2022-10-21 ENCOUNTER — Other Ambulatory Visit: Payer: Self-pay

## 2022-10-21 ENCOUNTER — Telehealth: Payer: Self-pay | Admitting: Obstetrics and Gynecology

## 2022-10-21 DIAGNOSIS — R61 Generalized hyperhidrosis: Secondary | ICD-10-CM

## 2022-10-21 DIAGNOSIS — N951 Menopausal and female climacteric states: Secondary | ICD-10-CM

## 2022-10-21 MED ORDER — PROGESTERONE MICRONIZED 100 MG PO CAPS: ORAL_CAPSULE | ORAL | 0 refills | Status: DC

## 2022-10-21 NOTE — Telephone Encounter (Signed)
Pt aware of labs. Adding FSH due to low estradiol levels. Will start prometrium for VS sx in meantime. Pt to f/u via MyChart in 3 months with sx/sooner prn. May then need to add ERT.  Also discussed that many women feel better with TSH closer to 1-2 (was 2 last yr) but could be higher currently due to stress. May need to re-eval TSH if sx don't improve with prog.  Stress mgmt.  If FSH shows menopause, will discuss IUD removal as well.

## 2022-10-25 ENCOUNTER — Encounter: Payer: Self-pay | Admitting: Obstetrics and Gynecology

## 2022-10-25 LAB — SPECIMEN STATUS REPORT

## 2022-10-25 LAB — FOLLICLE STIMULATING HORMONE: FSH: 79.3 m[IU]/mL

## 2022-11-02 ENCOUNTER — Ambulatory Visit: Payer: BC Managed Care – PPO

## 2022-11-10 ENCOUNTER — Ambulatory Visit
Admission: RE | Admit: 2022-11-10 | Discharge: 2022-11-10 | Disposition: A | Discharge status: Home / Self Care | Payer: BC Managed Care – PPO | Source: Ambulatory Visit | Attending: Obstetrics and Gynecology | Admitting: Obstetrics and Gynecology

## 2022-11-10 DIAGNOSIS — Z1231 Encounter for screening mammogram for malignant neoplasm of breast: Secondary | ICD-10-CM | POA: Diagnosis not present

## 2022-12-01 NOTE — Progress Notes (Unsigned)
   No chief complaint on file.    History of Present Illness:  Jennifer Freeman is a 47 y.o. that had a Mirena IUD placed 02/17/17. Since that time, she denies dyspareunia, pelvic pain, non-menstrual bleeding, vaginal d/c, heavy bleeding.    There were no vitals taken for this visit.  Pelvic exam:  Two IUD strings {DESC; PRESENT/ABSENT:17923} seen coming from the cervical os. EGBUS, vaginal vault and cervix: within normal limits  IUD Removal Strings of IUD identified and grasped.  IUD removed without problem with ring forceps.  Pt tolerated this well.  IUD noted to be intact.  Assessment:  No diagnosis found.    Plan: IUD removed and plan for contraception is {PLAN CONTRACEPTION:313102}. She was amenable to this plan.  Jakirah Zaun B. Diaz Crago, PA-C 12/01/2022 9:11 PM

## 2022-12-02 ENCOUNTER — Ambulatory Visit (INDEPENDENT_AMBULATORY_CARE_PROVIDER_SITE_OTHER): Payer: BC Managed Care – PPO | Admitting: Obstetrics and Gynecology

## 2022-12-02 ENCOUNTER — Encounter: Payer: Self-pay | Admitting: Obstetrics and Gynecology

## 2022-12-02 VITALS — BP 108/60 | Ht 66.0 in | Wt 234.0 lb

## 2022-12-02 DIAGNOSIS — Z30432 Encounter for removal of intrauterine contraceptive device: Secondary | ICD-10-CM | POA: Diagnosis not present

## 2022-12-02 DIAGNOSIS — N951 Menopausal and female climacteric states: Secondary | ICD-10-CM

## 2022-12-02 NOTE — Patient Instructions (Signed)
I value your feedback and you entrusting us with your care. If you get a [facility] patient survey, I would appreciate you taking the time to let us know about your experience today. Thank you! ? ? ?

## 2022-12-09 LAB — FOLLICLE STIMULATING HORMONE: FSH: 72.5 m[IU]/mL

## 2023-01-18 ENCOUNTER — Other Ambulatory Visit: Payer: Self-pay | Admitting: Obstetrics and Gynecology

## 2023-01-18 DIAGNOSIS — R61 Generalized hyperhidrosis: Secondary | ICD-10-CM

## 2023-01-18 DIAGNOSIS — N951 Menopausal and female climacteric states: Secondary | ICD-10-CM

## 2023-01-19 MED ORDER — PROGESTERONE MICRONIZED 100 MG PO CAPS: ORAL_CAPSULE | ORAL | 2 refills | Status: AC

## 2023-02-09 ENCOUNTER — Encounter: Payer: Self-pay | Admitting: Obstetrics and Gynecology

## 2023-02-10 ENCOUNTER — Other Ambulatory Visit: Payer: Self-pay | Admitting: Obstetrics and Gynecology

## 2023-02-10 MED ORDER — ESTRADIOL 0.5 MG PO TABS: 0.5000 mg | ORAL_TABLET | Freq: Every day | ORAL | 0 refills | Status: AC

## 2023-02-10 NOTE — Progress Notes (Signed)
Rx estradiol added to prometrium for vasomotor sx

## 2023-09-21 ENCOUNTER — Telehealth: Payer: Self-pay | Admitting: Obstetrics and Gynecology

## 2023-09-21 NOTE — Telephone Encounter (Signed)
Tried reaching out to patient to schedule annual appt after 10/20/23 with [facility], call unable to be completed.

## 2023-11-10 ENCOUNTER — Ambulatory Visit: Payer: BC Managed Care – PPO | Admitting: Obstetrics and Gynecology

## 2024-01-09 ENCOUNTER — Ambulatory Visit: Payer: BC Managed Care – PPO | Admitting: Obstetrics and Gynecology

## 2024-01-09 ENCOUNTER — Encounter: Payer: Self-pay | Admitting: Obstetrics and Gynecology

## 2024-01-09 NOTE — Progress Notes (Deleted)
 PCP: Claretta Fraise, DO   No chief complaint on file.   HPI:      Ms. Jennifer Freeman is a 48 y.o. G1P0101 whose LMP was No LMP recorded. (Menstrual status: IUD)., presents today for her annual examination.  Her menses are absent with IUD. No BTB, no dysmen. She does have vasomotor sx; worse for past 2-3 months. Night sweats more so than hot flashes, but pt is generally a cold natured person. Under increased stress this yr/recently. On gabapentin and cymbalta already. No recent thyroid labs, neg DM testing with PCP 11/22; has upcoming appt. Started on prometrium last yr after increased FSH but still had some estradiol.   Sex activity: single partner, contraception - IUD. Mirena placed 02/07/17. She does not have vaginal dryness. No pain/bleeding.   Last Pap: 10/15/21 Results were: no abnormalities /neg HPV DNA.   Last mammogram: 11/10/22  Results were: normal--routine follow-up in 12 months There is a FH of breast cancer in her PGM, genetic testing not indicated. There is no FH of ovarian cancer. The patient does do self-breast exams.  Colonoscopy: never; 8/22 neg Cologuard; Repeat due after 3  years.   Tobacco use: The patient denies current or previous tobacco use. Alcohol use: none No drug use Exercise: moderately active  She does get adequate calcium but not Vitamin D in her diet.  Labs with PCP.   Patient Active Problem List   Diagnosis Date Noted   Left breast mass 04/21/2017    Past Surgical History:  Procedure Laterality Date   CESAREAN SECTION     OTHER SURGICAL HISTORY  10/14/2022   cataract surgery left eye   OTHER SURGICAL HISTORY     Cataract Surgery 10/2022 11/2022    Family History  Problem Relation Age of Onset   Hypertension Father    Lung cancer Father    Breast cancer Paternal Grandmother 67    Social History   Socioeconomic History   Marital status: Married    Spouse name: Not on file   Number of children: Not on file   Years of  education: Not on file   Highest education level: Not on file  Occupational History   Not on file  Tobacco Use   Smoking status: Never   Smokeless tobacco: Never  Vaping Use   Vaping status: Never Used  Substance and Sexual Activity   Alcohol use: Yes   Drug use: Never   Sexual activity: Yes    Birth control/protection: I.U.D.    Comment: Mirena  Other Topics Concern   Not on file  Social History Narrative   Not on file   Social Drivers of Health   Financial Resource Strain: Not on file  Food Insecurity: No Food Insecurity (03/16/2021)   [facility] from [facility], [facility], [facility]   Hunger Vital Sign    Worried About Running Out of Food in the Last Year: Never true    Ran Out of Food in the Last Year: Never true  Transportation Needs: Not on file  Physical Activity: Not on file  Stress: Not on file  Social Connections: Not on file  Intimate Partner Violence: Not on file     Current Outpatient Medications:    DULoxetine (CYMBALTA) 30 MG capsule, Take 60 mg by mouth daily., Disp: , Rfl:    estradiol (ESTRACE) 0.5 MG tablet, Take 1 tablet (0.5 mg total) by mouth daily., Disp: 90 tablet, Rfl: 0   gabapentin (NEURONTIN) 300  MG capsule, TAKE 3 CAPSULE BY MOUTH UP TO THREE TIMES DAILY, Disp: , Rfl:    progesterone (PROMETRIUM) 100 MG capsule, Take 1 cap nightly, 6 nights on, 1 night off, Disp: 90 capsule, Rfl: 2   RESTASIS MULTIDOSE 0.05 % ophthalmic emulsion, INT 1 GTT IN OU BID, Disp: , Rfl:    RHOFADE 1 % CREA, Apply topically., Disp: , Rfl:    SOOLANTRA 1 % CREA, Apply topically at bedtime., Disp: , Rfl:    SUMAtriptan (IMITREX) 50 MG tablet, Take 2 tabs by mouth at headache onset. May repeat in 2 hrs with max 4 tabs in 24 hours, Disp: , Rfl:    topiramate (TOPAMAX) 100 MG tablet, , Disp: , Rfl:      ROS:  Review of Systems  Constitutional:  Negative for fatigue, fever and unexpected weight change.  Respiratory:  Negative for cough, shortness of  breath and wheezing.   Cardiovascular:  Negative for chest pain, palpitations and leg swelling.  Gastrointestinal:  Negative for blood in stool, constipation, diarrhea, nausea and vomiting.  Endocrine: Negative for cold intolerance, heat intolerance and polyuria.  Genitourinary:  Negative for dyspareunia, dysuria, flank pain, frequency, genital sores, hematuria, menstrual problem, pelvic pain, urgency, vaginal bleeding, vaginal discharge and vaginal pain.  Musculoskeletal:  Negative for back pain, joint swelling and myalgias.  Skin:  Negative for rash.  Neurological:  Negative for dizziness, syncope, light-headedness, numbness and headaches.  Hematological:  Negative for adenopathy.  Psychiatric/Behavioral:  Negative for agitation, confusion, sleep disturbance and suicidal ideas. The patient is not nervous/anxious.    BREAST: No symptoms    Objective: There were no vitals taken for this visit.   Physical Exam Constitutional:      Appearance: She is well-developed.  Genitourinary:     Vulva normal.     Right Labia: No rash, tenderness or lesions.    Left Labia: No tenderness, lesions or rash.    No vaginal discharge, erythema or tenderness.      Right Adnexa: not tender and no mass present.    Left Adnexa: not tender and no mass present.    No cervical friability or polyp.     IUD strings visualized.     Uterus is not enlarged or tender.  Breasts:    Right: No mass, nipple discharge, skin change or tenderness.     Left: No mass, nipple discharge, skin change or tenderness.  Neck:     Thyroid: No thyromegaly.  Cardiovascular:     Rate and Rhythm: Normal rate and regular rhythm.     Heart sounds: Normal heart sounds. No murmur heard. Pulmonary:     Effort: Pulmonary effort is normal.     Breath sounds: Normal breath sounds.  Abdominal:     Palpations: Abdomen is soft.     Tenderness: There is no abdominal tenderness. There is no guarding or rebound.  Musculoskeletal:         General: Normal range of motion.     Cervical back: Normal range of motion.  Lymphadenopathy:     Cervical: No cervical adenopathy.  Neurological:     General: No focal deficit present.     Mental Status: She is alert and oriented to person, place, and time.     Cranial Nerves: No cranial nerve deficit.  Skin:    General: Skin is warm and dry.  Psychiatric:        Mood and Affect: Mood normal.        Behavior: Behavior  normal.        Thought Content: Thought content normal.        Judgment: Judgment normal.  Vitals reviewed.     Assessment/Plan:  Encounter for annual routine gynecological examination  Encounter for routine checking of intrauterine contraceptive device (IUD)--IUD strings in cx os, has 8 yr indication  Encounter for screening mammogram for malignant neoplasm of breast - Plan: MM 3D SCREEN BREAST BILATERAL; pt to schedule mammo  Perimenopausal vasomotor symptoms - Plan: Estradiol; check labs. May add progesterone for sx; most likely due to increased stress.   Night sweats - Plan: TSH, T4, free  Thyroid disorder screening - Plan: TSH, T4, free  Menopause - Plan: Estradiol         GYN counsel breast self exam, mammography screening, menopause, adequate intake of calcium and vitamin D, diet and exercise    F/U  No follow-ups on file.  Jennifer Leiphart B. Sakshi Sermons, PA-C 01/09/2024 8:48 AM

## 2024-01-10 NOTE — Telephone Encounter (Signed)
 I received secure chat from front desk at 4:12 asking if pt could still be seen; stating the pt's appt was at 3:55 and she showed up at 4:11. FYI.
